# Patient Record
Sex: Male | Born: 1990 | Race: Black or African American | Hispanic: No | Marital: Single | Smoking: Never smoker
Health system: Southern US, Community
[De-identification: ages and names within clinical notes are randomized; demographics above are authoritative.]

---

## 2012-06-12 ENCOUNTER — Encounter (HOSPITAL_COMMUNITY): Payer: Self-pay | Admitting: Nurse Practitioner

## 2012-06-12 ENCOUNTER — Emergency Department (HOSPITAL_COMMUNITY)
Admission: EM | Admit: 2012-06-12 | Discharge: 2012-06-12 | Discharge status: Against Medical Advice | Payer: Self-pay | Attending: Emergency Medicine | Admitting: Emergency Medicine

## 2012-06-12 DIAGNOSIS — R112 Nausea with vomiting, unspecified: Secondary | ICD-10-CM | POA: Insufficient documentation

## 2012-06-12 DIAGNOSIS — R10819 Abdominal tenderness, unspecified site: Secondary | ICD-10-CM | POA: Insufficient documentation

## 2012-06-12 NOTE — ED Notes (Signed)
pt

## 2012-06-12 NOTE — ED Notes (Signed)
Pt presented to nurses station stating he felt "much better after vomiting" and wanting to leave. Encouraged pt to stay for md eval, pt states he will continue to wait. Shortly after pt told registration staff he was going to go home

## 2012-06-12 NOTE — ED Notes (Signed)
Per ems: pt reports a cold since yesterday, friend gave him "what he thought was 10 tylenol" but pt reports immediately after taking the pills he was concerned they may not be tylenol so called ems. Pt reports n/v since last night that is worse now, as well as onset abd tenderness after taking the pills. Pt is A&Ox4, breathing easily, VSS.

## 2016-10-22 ENCOUNTER — Encounter (HOSPITAL_COMMUNITY): Payer: Self-pay | Admitting: Emergency Medicine

## 2016-10-22 DIAGNOSIS — Y92410 Unspecified street and highway as the place of occurrence of the external cause: Secondary | ICD-10-CM | POA: Diagnosis not present

## 2016-10-22 DIAGNOSIS — Y939 Activity, unspecified: Secondary | ICD-10-CM | POA: Diagnosis not present

## 2016-10-22 DIAGNOSIS — Y998 Other external cause status: Secondary | ICD-10-CM | POA: Insufficient documentation

## 2016-10-22 DIAGNOSIS — S01511A Laceration without foreign body of lip, initial encounter: Secondary | ICD-10-CM | POA: Insufficient documentation

## 2016-10-22 DIAGNOSIS — S0990XA Unspecified injury of head, initial encounter: Secondary | ICD-10-CM | POA: Diagnosis present
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
## 2016-10-23 ENCOUNTER — Emergency Department (HOSPITAL_COMMUNITY): Payer: No Typology Code available for payment source

## 2016-10-23 ENCOUNTER — Emergency Department (HOSPITAL_COMMUNITY)
Admission: EM | Admit: 2016-10-23 | Discharge: 2016-10-23 | Disposition: A | Discharge status: Home / Self Care | Payer: No Typology Code available for payment source | Attending: Emergency Medicine | Admitting: Emergency Medicine

## 2016-10-23 DIAGNOSIS — S0003XA Contusion of scalp, initial encounter: Secondary | ICD-10-CM

## 2016-10-23 DIAGNOSIS — S01511A Laceration without foreign body of lip, initial encounter: Secondary | ICD-10-CM

## 2016-10-23 DIAGNOSIS — S0083XA Contusion of other part of head, initial encounter: Secondary | ICD-10-CM

## 2016-10-23 MED ORDER — LIDOCAINE-EPINEPHRINE (PF) 2 %-1:200000 IJ SOLN
20.0000 mL | Freq: Once | INTRAMUSCULAR | Status: AC
  Administered 2016-10-23: 20 mL
  Filled 2016-10-23: qty 20

## 2016-10-23 MED ORDER — HYDROCODONE-ACETAMINOPHEN 5-325 MG PO TABS
1.0000 | ORAL_TABLET | Freq: Once | ORAL | Status: AC
  Administered 2016-10-23: 1 via ORAL
  Filled 2016-10-23: qty 1

## 2016-10-23 MED ORDER — NAPROXEN 500 MG PO TABS
500.0000 mg | ORAL_TABLET | Freq: Two times a day (BID) | ORAL | 0 refills | Status: DC
Start: 2016-10-23 — End: 2020-07-13

## 2016-10-23 MED ORDER — HYDROCODONE-ACETAMINOPHEN 5-325 MG PO TABS: 1.0000 | ORAL_TABLET | ORAL | 0 refills | Status: DC | PRN
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
## 2017-07-04 DIAGNOSIS — Y939 Activity, unspecified: Secondary | ICD-10-CM | POA: Insufficient documentation

## 2017-07-04 DIAGNOSIS — S82114A Nondisplaced fracture of right tibial spine, initial encounter for closed fracture: Secondary | ICD-10-CM | POA: Insufficient documentation

## 2017-07-04 DIAGNOSIS — S82141A Displaced bicondylar fracture of right tibia, initial encounter for closed fracture: Secondary | ICD-10-CM | POA: Insufficient documentation

## 2017-07-04 DIAGNOSIS — S83511A Sprain of anterior cruciate ligament of right knee, initial encounter: Secondary | ICD-10-CM | POA: Insufficient documentation

## 2017-07-04 DIAGNOSIS — Y929 Unspecified place or not applicable: Secondary | ICD-10-CM | POA: Insufficient documentation

## 2017-07-04 DIAGNOSIS — Y999 Unspecified external cause status: Secondary | ICD-10-CM | POA: Insufficient documentation

## 2017-07-04 DIAGNOSIS — W1830XA Fall on same level, unspecified, initial encounter: Secondary | ICD-10-CM | POA: Insufficient documentation

## 2017-07-05 ENCOUNTER — Emergency Department (HOSPITAL_COMMUNITY)
Admission: EM | Admit: 2017-07-05 | Discharge: 2017-07-05 | Disposition: A | Discharge status: Home / Self Care | Payer: Self-pay | Attending: Emergency Medicine | Admitting: Emergency Medicine

## 2017-07-05 ENCOUNTER — Other Ambulatory Visit: Payer: Self-pay

## 2017-07-05 ENCOUNTER — Emergency Department (HOSPITAL_COMMUNITY): Payer: Self-pay

## 2017-07-05 DIAGNOSIS — S83511A Sprain of anterior cruciate ligament of right knee, initial encounter: Secondary | ICD-10-CM

## 2017-07-05 DIAGNOSIS — S82141A Displaced bicondylar fracture of right tibia, initial encounter for closed fracture: Secondary | ICD-10-CM

## 2017-07-05 DIAGNOSIS — S82116A Nondisplaced fracture of unspecified tibial spine, initial encounter for closed fracture: Secondary | ICD-10-CM

## 2017-07-05 MED ORDER — IBUPROFEN 200 MG PO TABS
400.0000 mg | ORAL_TABLET | Freq: Once | ORAL | Status: AC | PRN
  Administered 2017-07-05: 400 mg via ORAL
  Filled 2017-07-05: qty 2

## 2017-07-05 MED ORDER — IBUPROFEN 600 MG PO TABS: 600.0000 mg | ORAL_TABLET | Freq: Four times a day (QID) | ORAL | 0 refills | Status: DC | PRN

## 2017-07-05 MED ORDER — HYDROCODONE-ACETAMINOPHEN 5-325 MG PO TABS: 1.0000 | ORAL_TABLET | Freq: Four times a day (QID) | ORAL | 0 refills | Status: DC | PRN

## 2017-07-05 MED ORDER — ACETAMINOPHEN 500 MG PO TABS
1000.0000 mg | ORAL_TABLET | Freq: Once | ORAL | Status: AC
  Administered 2017-07-05: 1000 mg via ORAL
  Filled 2017-07-05: qty 2

## 2017-07-05 MED ORDER — OXYCODONE-ACETAMINOPHEN 5-325 MG PO TABS
2.0000 | ORAL_TABLET | Freq: Once | ORAL | Status: DC
  Filled 2017-07-05: qty 2

## 2017-07-05 NOTE — ED Notes (Signed)
Bed: WA04 Expected date:  Expected time:  Means of arrival:  Comments: 

## 2017-07-05 NOTE — ED Triage Notes (Signed)
Pt was playing basketball and came down on his right knee wrong and heard a pop. Pt complains of pain 9/10 aching pain. Pt cannot lift leg or stretch it.

## 2017-07-05 NOTE — ED Notes (Signed)
Assumed care of patient.

## 2017-07-05 NOTE — Discharge Instructions (Signed)
Wear a knee immobilizer at all times.  You may remove this when showering or bathing.  Continue to ice your knee 3-4 times per day for 15-20 minutes each time.  Refrain from putting any weight on your right leg.  You have been prescribed ibuprofen to take every 6 hours for pain management and inflammation.  You may supplement this with Norco as prescribed.  Do not drive or drink alcohol after taking Norco as it may make you drowsy and impair your judgment.  Do not take Tylenol/acetaminophen when taking Norco as there is already Tylenol in this medication.    Call the office of Dr. Linna CapriceSwinteck in the morning to schedule close outpatient follow-up to discuss additional treatment and management of your right knee injury.  You may return to the emergency department for new or concerning symptoms.
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
## 2020-01-11 ENCOUNTER — Emergency Department (HOSPITAL_COMMUNITY)
Admission: EM | Admit: 2020-01-11 | Discharge: 2020-01-11 | Disposition: A | Discharge status: Home / Self Care | Payer: Self-pay | Attending: Emergency Medicine | Admitting: Emergency Medicine

## 2020-01-11 DIAGNOSIS — R0789 Other chest pain: Secondary | ICD-10-CM | POA: Insufficient documentation

## 2020-01-11 DIAGNOSIS — Z5321 Procedure and treatment not carried out due to patient leaving prior to being seen by health care provider: Secondary | ICD-10-CM | POA: Insufficient documentation
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
## 2020-01-11 NOTE — ED Notes (Signed)
Pt given discharge paperwork and left in police custody.
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
## 2020-07-13 ENCOUNTER — Emergency Department (HOSPITAL_COMMUNITY)
Admission: EM | Admit: 2020-07-13 | Discharge: 2020-07-13 | Disposition: A | Discharge status: Home / Self Care | Attending: Emergency Medicine | Admitting: Emergency Medicine

## 2020-07-13 ENCOUNTER — Emergency Department (HOSPITAL_COMMUNITY)

## 2020-07-13 ENCOUNTER — Encounter (HOSPITAL_COMMUNITY): Payer: Self-pay

## 2020-07-13 DIAGNOSIS — R112 Nausea with vomiting, unspecified: Secondary | ICD-10-CM | POA: Insufficient documentation

## 2020-07-13 DIAGNOSIS — R1013 Epigastric pain: Secondary | ICD-10-CM | POA: Diagnosis present

## 2020-07-13 DIAGNOSIS — R197 Diarrhea, unspecified: Secondary | ICD-10-CM | POA: Insufficient documentation

## 2020-07-13 LAB — CBC WITH DIFFERENTIAL/PLATELET
Abs Immature Granulocytes: 0.01 10*3/uL (ref 0.00–0.07)
Basophils Absolute: 0.1 10*3/uL (ref 0.0–0.1)
Basophils Relative: 1 %
Eosinophils Absolute: 0.1 10*3/uL (ref 0.0–0.5)
Eosinophils Relative: 1 %
HCT: 48.2 % (ref 39.0–52.0)
Hemoglobin: 16.2 g/dL (ref 13.0–17.0)
Immature Granulocytes: 0 %
Lymphocytes Relative: 31 %
Lymphs Abs: 1.7 10*3/uL (ref 0.7–4.0)
MCH: 30.3 pg (ref 26.0–34.0)
MCHC: 33.6 g/dL (ref 30.0–36.0)
MCV: 90.3 fL (ref 80.0–100.0)
Monocytes Absolute: 0.5 10*3/uL (ref 0.1–1.0)
Monocytes Relative: 9 %
Neutro Abs: 3.2 10*3/uL (ref 1.7–7.7)
Neutrophils Relative %: 58 %
Platelets: 189 10*3/uL (ref 150–400)
RBC: 5.34 MIL/uL (ref 4.22–5.81)
RDW: 12.3 % (ref 11.5–15.5)
WBC: 5.5 10*3/uL (ref 4.0–10.5)
nRBC: 0 % (ref 0.0–0.2)

## 2020-07-13 LAB — COMPREHENSIVE METABOLIC PANEL
ALT: 26 U/L (ref 0–44)
AST: 26 U/L (ref 15–41)
Albumin: 4.8 g/dL (ref 3.5–5.0)
Alkaline Phosphatase: 61 U/L (ref 38–126)
Anion gap: 10 (ref 5–15)
BUN: 12 mg/dL (ref 6–20)
CO2: 26 mmol/L (ref 22–32)
Calcium: 9.9 mg/dL (ref 8.9–10.3)
Chloride: 106 mmol/L (ref 98–111)
Creatinine, Ser: 1.18 mg/dL (ref 0.61–1.24)
GFR, Estimated: >60 mL/min (ref >60)
Glucose, Bld: 98 mg/dL (ref 70–99)
Potassium: 4 mmol/L (ref 3.5–5.1)
Sodium: 142 mmol/L (ref 135–145)
Total Bilirubin: 1 mg/dL (ref 0.3–1.2)
Total Protein: 8.1 g/dL (ref 6.5–8.1)

## 2020-07-13 LAB — URINALYSIS, ROUTINE W REFLEX MICROSCOPIC
Bilirubin Urine: NEGATIVE
Glucose, UA: NEGATIVE mg/dL
Hgb urine dipstick: NEGATIVE
Ketones, ur: 20 mg/dL — AB
Leukocytes,Ua: NEGATIVE
Nitrite: NEGATIVE
Protein, ur: NEGATIVE mg/dL
Specific Gravity, Urine: 1.014 (ref 1.005–1.030)
pH: 7 (ref 5.0–8.0)

## 2020-07-13 LAB — LIPASE, BLOOD: Lipase: 24 U/L (ref 11–51)

## 2020-07-13 MED ORDER — IOHEXOL 300 MG/ML  SOLN
100.0000 mL | Freq: Once | INTRAMUSCULAR | Status: AC | PRN
  Administered 2020-07-13: 100 mL via INTRAVENOUS

## 2020-07-13 MED ORDER — SODIUM CHLORIDE 0.9 % IV BOLUS
1000.0000 mL | Freq: Once | INTRAVENOUS | Status: AC
  Administered 2020-07-13: 1000 mL via INTRAVENOUS

## 2020-07-13 MED ORDER — ALUM & MAG HYDROXIDE-SIMETH 200-200-20 MG/5ML PO SUSP
30.0000 mL | Freq: Once | ORAL | Status: AC
  Administered 2020-07-13: 30 mL via ORAL
  Filled 2020-07-13: qty 30

## 2020-07-13 MED ORDER — LIDOCAINE VISCOUS HCL 2 % MT SOLN
15.0000 mL | Freq: Once | OROMUCOSAL | Status: AC
  Administered 2020-07-13: 15 mL via ORAL
  Filled 2020-07-13: qty 15

## 2020-07-13 MED ORDER — PANTOPRAZOLE SODIUM 20 MG PO TBEC: 20.0000 mg | DELAYED_RELEASE_TABLET | Freq: Every day | ORAL | 0 refills | Status: AC

## 2020-07-13 NOTE — ED Provider Notes (Signed)
[facility]-EMERGENCY DEPT Provider Note   CSN: [id number] Arrival date & time: 07/13/20  1240     History Chief Complaint  Patient presents with  . Abdominal Pain    Antonio Murray is a 30 y.o. male with no pertinent past medical history.  Patient presents with chief complaint of epigastric pain.  Patient reports pain began suddenly approximately 1100 while at rest.  Patient describes pain as "stabbing and pounding."  Patient rates pain 7/10 on the pain scale.  Patient reports pain is constant however waxes and wanes in intensity.  Patient denies any aggravating or alleviating factors.  Patient reports he has had no change in pain with eating.  Patient reports that he felt his pain radiates to his neck once into his right thigh once.  Patient endorses nausea and vomiting.  Reports he had 1 episode of vomiting earlier this morning.  Patient describes his emesis as clear.  He denies any bloody emesis or coffee-ground emesis.  Patient denies any history of abdominal surgeries.  Patient denies any fevers, chills, abdominal distention, rectal bleeding, constipation, urinary symptoms, swelling to genitals, tenderness to genitals, penile discharge, dizziness, lightheadedness, syncope, numbness, weakness, chest pain, shortness of breath.  HPI     History reviewed. No pertinent past medical history.  There are no problems to display for this patient.   History reviewed. No pertinent surgical history.     History reviewed. No pertinent family history.  Social History   Tobacco Use  . Smoking status: Never Smoker  . Smokeless tobacco: Never Used    Home Medications Prior to Admission medications   Medication Sig Start Date End Date Taking? Authorizing Provider  diphenhydrAMINE (BENADRYL) 25 mg capsule Take 25 mg by mouth every 6 (six) hours as needed for allergies.   Yes [provider]  loratadine (CLARITIN) 10 MG tablet Take 10 mg by mouth daily as  needed for allergies.   Yes [provider]    Allergies    Patient has no known allergies.  Review of Systems   Review of Systems  Constitutional: Negative for chills and fever.  Eyes: Negative for visual disturbance.  Respiratory: Negative for shortness of breath.   Cardiovascular: Negative for chest pain.  Gastrointestinal: Positive for abdominal pain, nausea and vomiting. Negative for abdominal distention, anal bleeding, blood in stool, constipation, diarrhea and rectal pain.  Genitourinary: Negative for decreased urine volume, difficulty urinating, dysuria, flank pain, frequency, genital sores, hematuria, penile discharge, penile pain, penile swelling, scrotal swelling and testicular pain.  Musculoskeletal: Negative for back pain and neck pain.  Skin: Negative for color change and rash.  Neurological: Negative for dizziness, syncope, weakness, light-headedness, numbness and headaches.  Psychiatric/Behavioral: Negative for confusion.    Physical Exam Updated Vital Signs BP 132/77   Pulse 61   Temp 98.3 F (36.8 C) (Oral)   Resp 17   SpO2 100%   Physical Exam Vitals and nursing note reviewed.  Constitutional:      General: He is not in acute distress.    Appearance: He is not ill-appearing, toxic-appearing or diaphoretic.     Interventions: He is restrained.     Comments: Patient is in police custody and restrained via handcuffs  HENT:     Head: Normocephalic.  Eyes:     General: No scleral icterus.       Right eye: No discharge.        Left eye: No discharge.  Cardiovascular:     Rate and  Rhythm: Normal rate.     Pulses:          Carotid pulses are 3+ on the right side and 3+ on the left side.      Radial pulses are 3+ on the right side and 3+ on the left side.       Femoral pulses are 3+ on the right side and 3+ on the left side.    Heart sounds: Normal heart sounds.  Pulmonary:     Effort: Pulmonary effort is normal. No respiratory distress.      Breath sounds: Normal breath sounds.  Abdominal:     General: Abdomen is flat. Bowel sounds are normal. There is no distension. There are no signs of injury.     Palpations: Abdomen is soft. There is no mass or pulsatile mass.     Tenderness: There is abdominal tenderness in the epigastric area. There is no right CVA tenderness, left CVA tenderness, guarding or rebound. Negative signs include psoas sign.     Hernia: There is no hernia in the umbilical area or ventral area.  Musculoskeletal:     Cervical back: Neck supple.  Skin:    General: Skin is warm and dry.     Coloration: Skin is not jaundiced or pale.  Neurological:     General: No focal deficit present.     Mental Status: He is alert.  Psychiatric:        Behavior: Behavior is cooperative.     ED Results / Procedures / Treatments   Labs (all labs ordered are listed, but only abnormal results are displayed) Labs Reviewed  URINALYSIS, ROUTINE W REFLEX MICROSCOPIC - Abnormal; Notable for the following components:      Result Value   Ketones, ur 20 (*)    All other components within normal limits  CBC WITH DIFFERENTIAL/PLATELET  COMPREHENSIVE METABOLIC PANEL  LIPASE, BLOOD    EKG None  Radiology CT Abdomen Pelvis W Contrast  Result Date: 07/13/2020 CLINICAL DATA:  30 year old male with epigastric pain. EXAM: CT ABDOMEN AND PELVIS WITH CONTRAST TECHNIQUE: Multidetector CT imaging of the abdomen and pelvis was performed using the standard protocol following bolus administration of intravenous contrast. CONTRAST:  OMNIPAQUE IOHEXOL 300 MG/ML  SOLN COMPARISON:  None. FINDINGS: Lower chest: The visualized lung bases are clear. No intra-abdominal free air or free fluid. Hepatobiliary: No focal liver abnormality is seen. No gallstones, gallbladder wall thickening, or biliary dilatation. Pancreas: Unremarkable. No pancreatic ductal dilatation or surrounding inflammatory changes. Spleen: Normal in size without focal  abnormality. Adrenals/Urinary Tract: The adrenal glands are unremarkable. The kidneys, visualized ureters, and urinary bladder appear unremarkable. Stomach/Bowel: There is no bowel obstruction or active inflammation. The appendix is normal. Vascular/Lymphatic: The abdominal aorta and IVC are unremarkable. No portal venous gas. There is no adenopathy. Reproductive: The prostate and seminal vesicles are grossly unremarkable. No pelvic mass. Other: None Musculoskeletal: No acute or significant osseous findings. IMPRESSION: No acute intra-abdominal or pelvic pathology. Electronically Signed   By: Elgie Collard M.D.   On: 07/13/2020 17:12    Procedures Procedures   Medications Ordered in ED Medications  alum & mag hydroxide-simeth (MAALOX/MYLANTA) 200-200-20 MG/5ML suspension 30 mL (30 mLs Oral Given 07/13/20 1542)    And  lidocaine (XYLOCAINE) 2 % viscous mouth solution 15 mL (15 mLs Oral Given 07/13/20 1542)  sodium chloride 0.9 % bolus 1,000 mL (0 mLs Intravenous Stopped 07/13/20 1755)  iohexol (OMNIPAQUE) 300 MG/ML solution 100 mL (100 mLs Intravenous Contrast Given  07/13/20 1708)    ED Course  I have reviewed the triage vital signs and the nursing notes.  Pertinent labs & imaging results that were available during my care of the patient were reviewed by me and considered in my medical decision making (see chart for details).    MDM Rules/Calculators/A&P                          Alert 30 year old male no acute distress, nontoxic-appearing.  Patient presents with chief complaint of epigastric pain.  Patient reports sudden onset of "stabbing/pounding" pain.  Patient reports that pain started at 1100, while patient was at rest.  Pain has been intermittent since then.  Patient denies any change in symptoms with p.o. intake or exertion.  Patient denies any alleviating factors.  Patient reports that he had 1 instance of radiation up to the right side of his neck and another incidence of radiation  down to his right thigh.  On physical exam patient abdomen has normoactive bowel sounds.  Abdomen is soft, nondistended, tenderness to epigastric area.  No rebound tenderness, guarding, mass, pulsatile mass, umbilical hernia, ventral hernia, CVA tenderness observed.  Patient has +3 carotid, radial, and femoral pulses bilaterally.  Patient is hemodynamically stable.  Low suspicion for aortic dissection at this time. Will obtain urinalysis, CMP, CBC, lipase.  Patient given GI cocktail.  Lipase within normal limits. CMP within normal limits. CBC within normal limits. Urinalysis shows no signs of infection.  Ketones were noted this is likely due to decreased p.o. intake today due to his pain and leaving jail to come to the emergency department.  Patient reports improvement in his symptoms after receiving GI cocktail.  Will obtain CT abdomen pelvis to evaluate for possible intra-abdominal cause of his epigastric pain. CT abdomen pelvis showed no acute intra-abdominal or pelvic pathology.  On repeat examination patient's abdomen remains soft, nondistended.  Patient hemodynamically stable.  Patient able to tolerate p.o. intake without difficulty.  We will start patient on Protonix for possible GERD and/or peptic ulcer disease.  Patient advised to follow-up with primary care provider.  Discussed results, findings, treatment and follow up. Patient advised of return precautions. Patient verbalized understanding and agreed with plan.   Final Clinical Impression(s) / ED Diagnoses Final diagnoses:  Epigastric pain  Nausea vomiting and diarrhea    Rx / DC Orders ED Discharge Orders         Ordered    pantoprazole (PROTONIX) 20 MG tablet  Daily        07/13/20 1749           Haskel Schroeder, PA-C 07/14/20 0000    Charlynne Pander, MD 07/15/20 1500

## 2020-07-13 NOTE — ED Triage Notes (Signed)
Pt arrived via police, from jail, states he started vomiting, was feeling better and then once back in cell pain and vomiting worsening.

## 2020-07-13 NOTE — ED Triage Notes (Signed)
Emergency Medicine Provider Triage Evaluation Note  Antonio Murray , a 30 y.o. male  was evaluated in triage.  Pt complains of sharp stomach pain, umbilical pain. Vomiting. Diarrhea. Began this AM.   Review of Systems  Positive: Abdominal pain, diarrhea, vomiting  Negative: fever  Physical Exam  BP (!) 145/80 (BP Location: Left Arm)   Pulse 94   Temp 98.3 F (36.8 C) (Oral)   Resp 16   SpO2 95%  Gen:   Awake, no distress   HEENT:  Atraumatic  Resp:  Normal effort  Cardiac:  Normal rate  Abd:   Nondistended,tender to umbilical area, no guarding  MSK:   Moves extremities without difficulty  Neuro:  Speech clear   Medical Decision Making  Medically screening exam initiated at 12:50 PM.  Appropriate orders placed.  Aldo Sondgeroth was informed that the remainder of the evaluation will be completed by another provider, this initial triage assessment does not replace that evaluation, and the importance of remaining in the ED until their evaluation is complete.  Clinical Impression  Stable  MSE was initiated and I personally evaluated the patient and placed orders (if any) at  12:51 PM on July 13, 2020.  The patient appears stable so that the remainder of the MSE may be completed by another provider.    Farrel Gordon, PA-C 07/13/20 1252

## 2020-07-13 NOTE — Discharge Instructions (Addendum)
You came to the emerge department today to be evaluated for your epigastric abdominal pain.  Your physical exam, lab work, and CT scan were reassuring.  We can find no acute cause of your symptoms today.  I have started you on a medication Protonix.  Please take 1 pill once daily to help with any acid reflux you may be experiencing.  Please follow-up with your primary care provider.  Get help right away if: Your pain does not go away as soon as your health care provider told you to expect. You cannot stop vomiting. Your pain is only in areas of the abdomen, such as the right side or the left lower portion of the abdomen. Pain on the right side could be caused by appendicitis. You have bloody or black stools, or stools that look like tar. You have severe pain, cramping, or bloating in your abdomen. You have signs of dehydration, such as: Dark urine, very little urine, or no urine. Cracked lips. Dry mouth. Sunken eyes. Sleepiness. Weakness. You have trouble breathing or chest pain.

## 2021-11-08 IMAGING — CT CT ABD-PELV W/ CM
2 of 4 series · 16 of 46 positions shown, 18 images · IV contrast (OMNIPAQUE 300)
Comparison: None.

CLINICAL DATA: 29-year-old male with epigastric pain.

EXAM:
CT ABDOMEN AND PELVIS WITH CONTRAST
TECHNIQUE: Multidetector CT imaging of the abdomen and pelvis was performed
using the standard protocol following bolus administration of
intravenous contrast.
CONTRAST:  100mL OMNIPAQUE IOHEXOL 300 MG/ML  SOLN

[Series 2: axial st · axial · 0.81mm/px · z∈[-519,-144]mm · 13 of 85 slices shown, 15 images]
[im 5/85  soft-tissue]
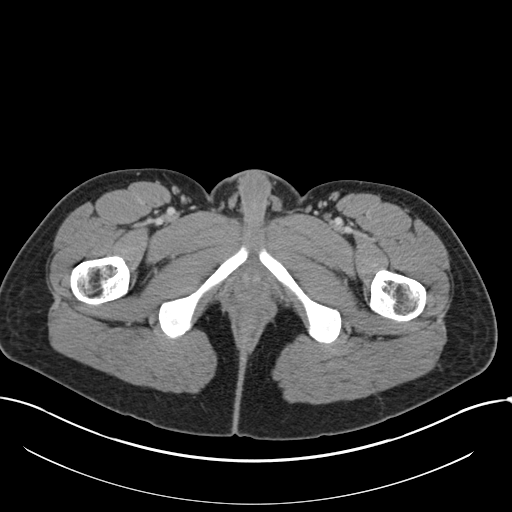
[im 5/85  bone]
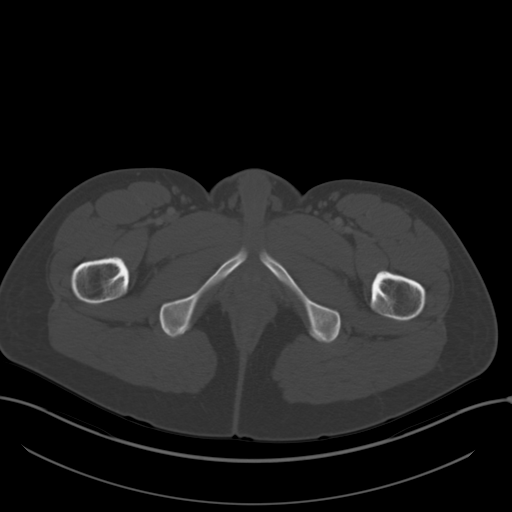
[im 10/85  soft-tissue]
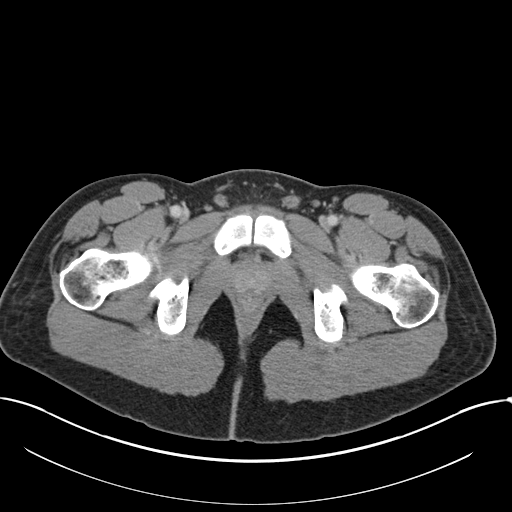
[im 19/85  soft-tissue]
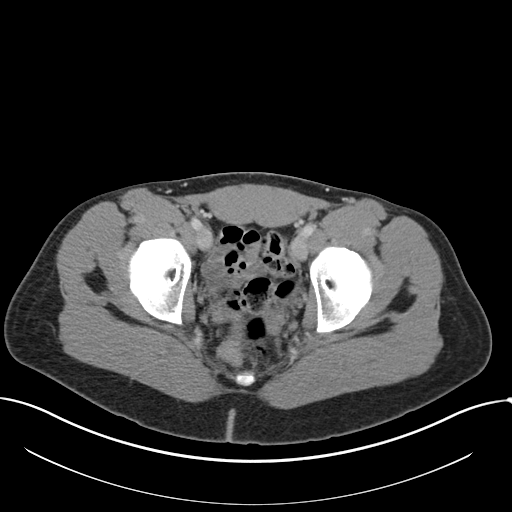
[im 24/85  soft-tissue]
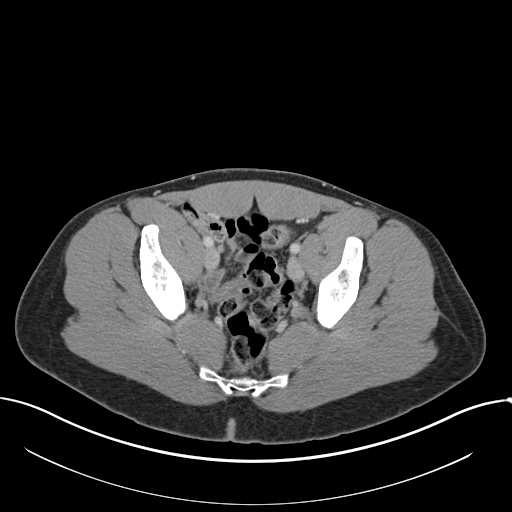
[im 29/85  soft-tissue]
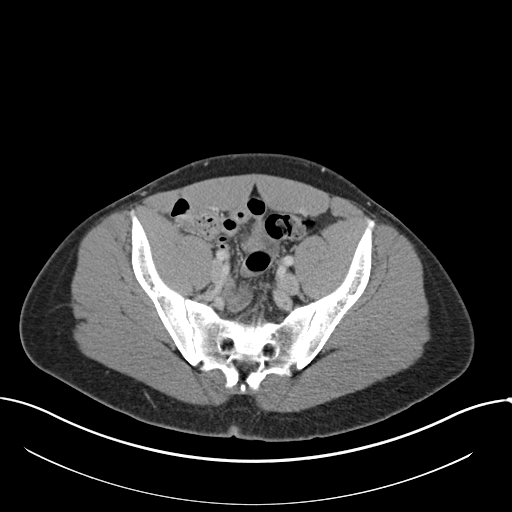
[im 38/85  soft-tissue]
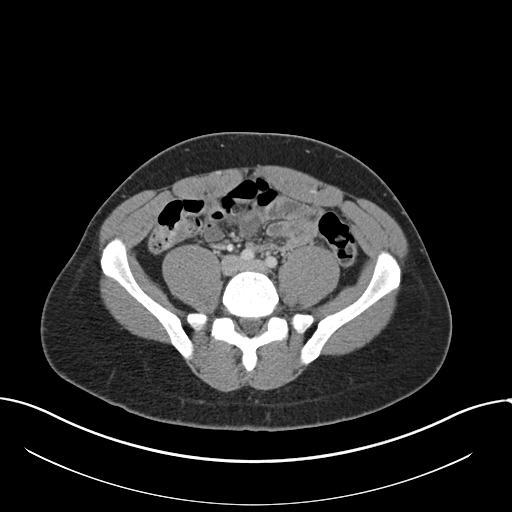
[im 43/85  soft-tissue]
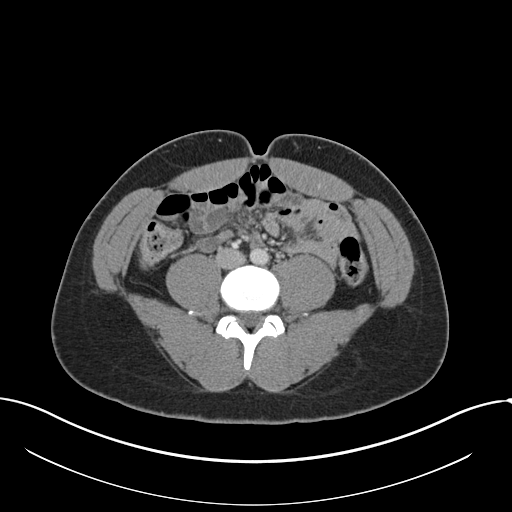
[im 47/85  soft-tissue]
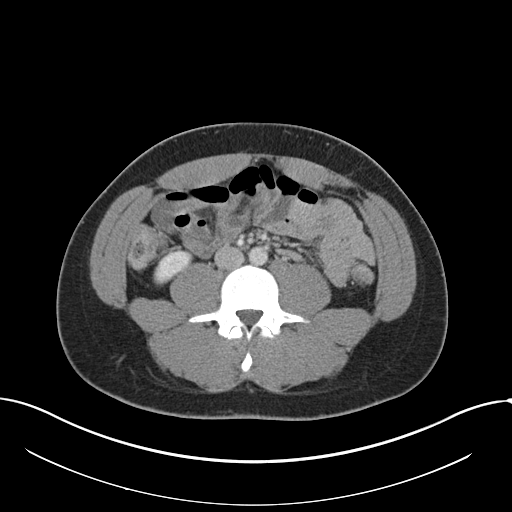
[im 57/85  soft-tissue]
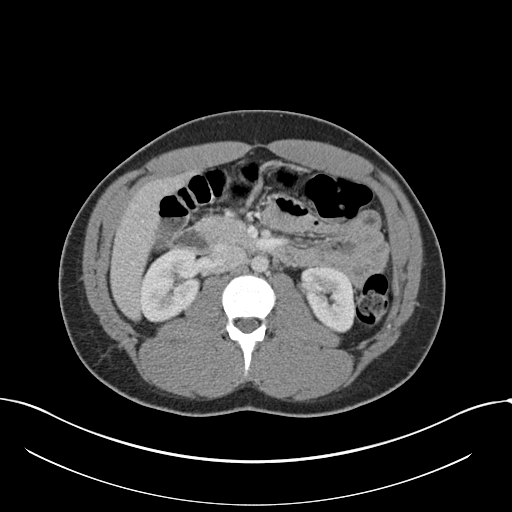
[im 57/85  bone]
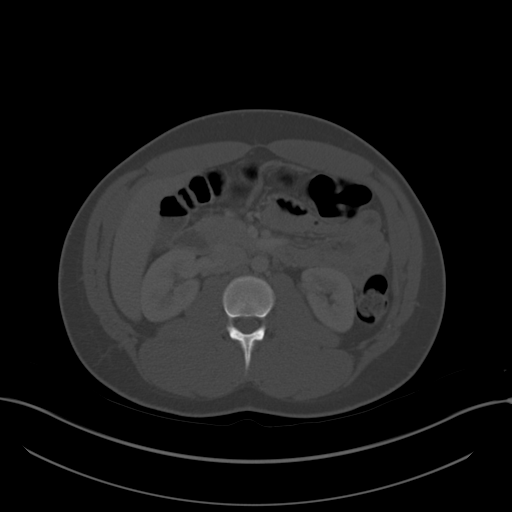
[im 61/85  soft-tissue]
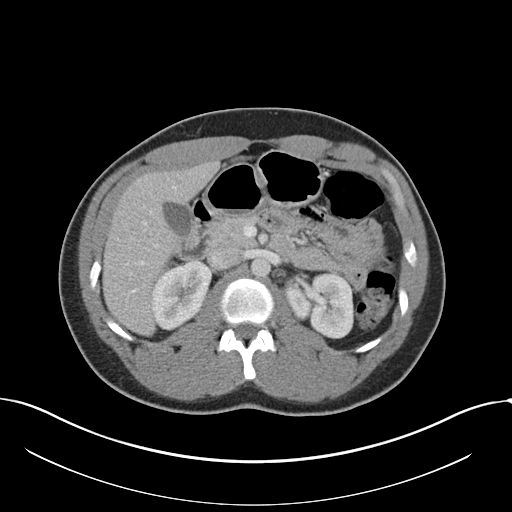
[im 66/85  soft-tissue]
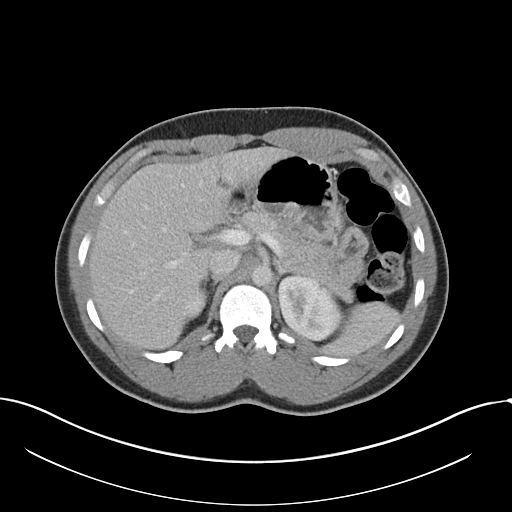
[im 75/85  soft-tissue]
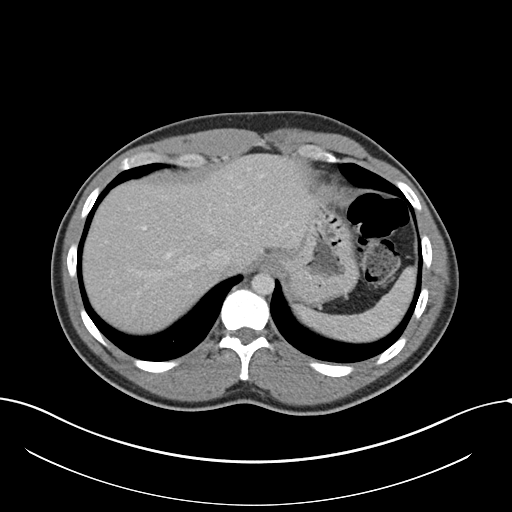
[im 80/85  soft-tissue]
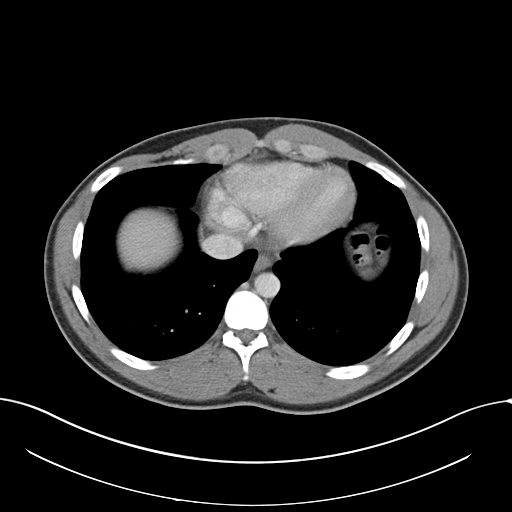

[Series 5: coronal st · coronal · 0.67mm/px · 3 of 151 slices shown]
[im 51/151  soft-tissue]
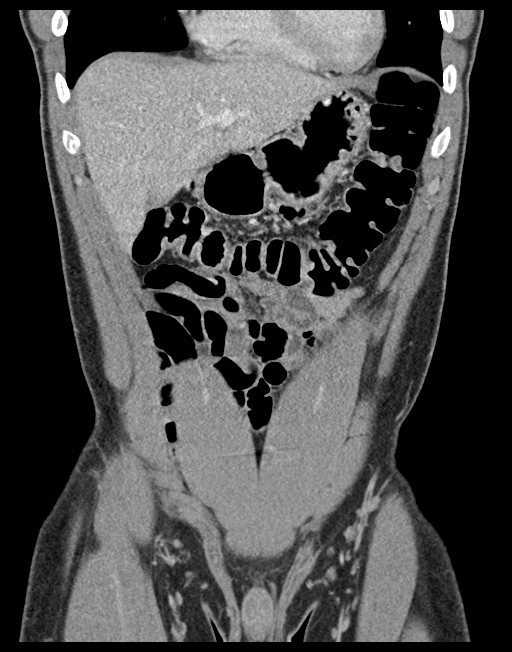
[im 67/151  soft-tissue]
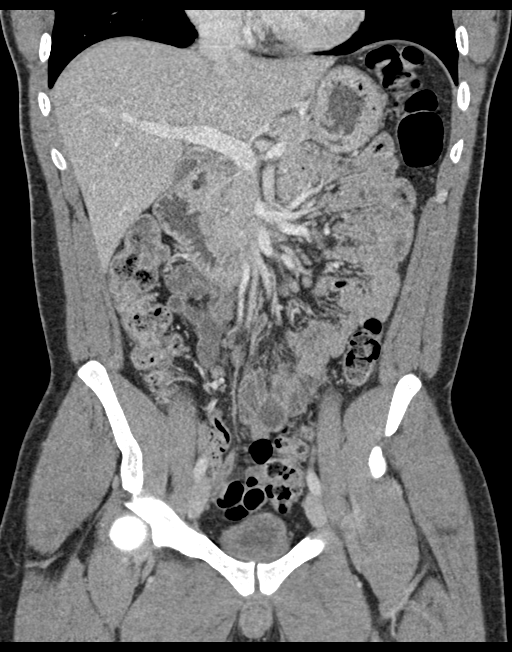
[im 84/151  soft-tissue]
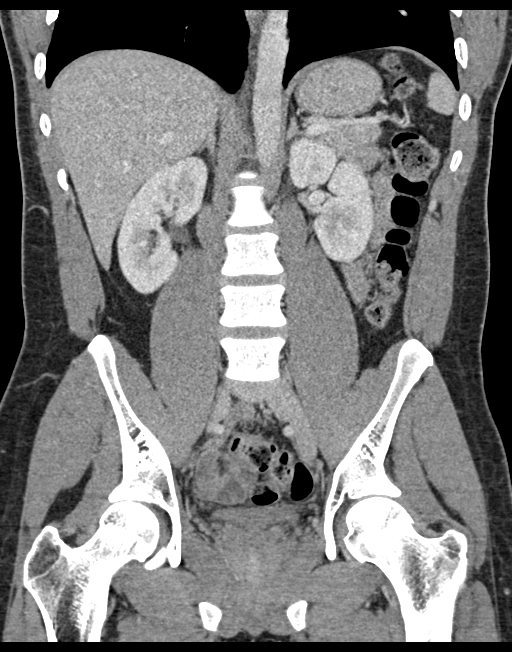

[16 of 46 positions shown; findings below may reference images not displayed]

FINDINGS: Lower chest: The visualized lung bases are clear.

No intra-abdominal free air or free fluid.

Hepatobiliary: No focal liver abnormality is seen. No gallstones,
gallbladder wall thickening, or biliary dilatation.

Pancreas: Unremarkable. No pancreatic ductal dilatation or
surrounding inflammatory changes.

Spleen: Normal in size without focal abnormality.

Adrenals/Urinary Tract: The adrenal glands are unremarkable. The
kidneys, visualized ureters, and urinary bladder appear
unremarkable.

Stomach/Bowel: There is no bowel obstruction or active inflammation.
The appendix is normal.

Vascular/Lymphatic: The abdominal aorta and IVC are unremarkable. No
portal venous gas. There is no adenopathy.

Reproductive: The prostate and seminal vesicles are grossly
unremarkable. No pelvic mass.

Other: None

Musculoskeletal: No acute or significant osseous findings.
IMPRESSION: No acute intra-abdominal or pelvic pathology.

## 2021-11-13 ENCOUNTER — Emergency Department (HOSPITAL_COMMUNITY): Payer: Self-pay

## 2021-11-13 ENCOUNTER — Other Ambulatory Visit: Payer: Self-pay

## 2021-11-13 ENCOUNTER — Emergency Department (HOSPITAL_COMMUNITY)

## 2021-11-13 ENCOUNTER — Emergency Department (HOSPITAL_COMMUNITY)
Admission: EM | Admit: 2021-11-13 | Discharge: 2021-11-13 | Disposition: A | Discharge status: Home / Self Care | Payer: Self-pay | Attending: Emergency Medicine | Admitting: Emergency Medicine

## 2021-11-13 DIAGNOSIS — Y92410 Unspecified street and highway as the place of occurrence of the external cause: Secondary | ICD-10-CM | POA: Insufficient documentation

## 2021-11-13 DIAGNOSIS — S20212A Contusion of left front wall of thorax, initial encounter: Secondary | ICD-10-CM | POA: Insufficient documentation

## 2021-11-13 DIAGNOSIS — M542 Cervicalgia: Secondary | ICD-10-CM | POA: Diagnosis not present

## 2021-11-13 DIAGNOSIS — D696 Thrombocytopenia, unspecified: Secondary | ICD-10-CM | POA: Diagnosis not present

## 2021-11-13 DIAGNOSIS — S0101XA Laceration without foreign body of scalp, initial encounter: Secondary | ICD-10-CM | POA: Diagnosis present

## 2021-11-13 LAB — I-STAT CHEM 8, ED
BUN: 16 mg/dL (ref 6–20)
Calcium, Ion: 1.22 mmol/L (ref 1.15–1.40)
Chloride: 102 mmol/L (ref 98–111)
Creatinine, Ser: 1.1 mg/dL (ref 0.61–1.24)
Glucose, Bld: 106 mg/dL — ABNORMAL HIGH (ref 70–99)
HCT: 42 % (ref 39.0–52.0)
Hemoglobin: 14.3 g/dL (ref 13.0–17.0)
Potassium: 3.7 mmol/L (ref 3.5–5.1)
Sodium: 142 mmol/L (ref 135–145)
TCO2: 28 mmol/L (ref 22–32)

## 2021-11-13 LAB — CBC WITH DIFFERENTIAL/PLATELET
Abs Immature Granulocytes: 0.01 10*3/uL (ref 0.00–0.07)
Basophils Absolute: 0 10*3/uL (ref 0.0–0.1)
Basophils Relative: 1 %
Eosinophils Absolute: 0.2 10*3/uL (ref 0.0–0.5)
Eosinophils Relative: 4 %
HCT: 42.2 % (ref 39.0–52.0)
Hemoglobin: 14.1 g/dL (ref 13.0–17.0)
Immature Granulocytes: 0 %
Lymphocytes Relative: 25 %
Lymphs Abs: 1.3 10*3/uL (ref 0.7–4.0)
MCH: 30.7 pg (ref 26.0–34.0)
MCHC: 33.4 g/dL (ref 30.0–36.0)
MCV: 91.7 fL (ref 80.0–100.0)
Monocytes Absolute: 0.4 10*3/uL (ref 0.1–1.0)
Monocytes Relative: 7 %
Neutro Abs: 3.3 10*3/uL (ref 1.7–7.7)
Neutrophils Relative %: 63 %
Platelets: 149 10*3/uL — ABNORMAL LOW (ref 150–400)
RBC: 4.6 MIL/uL (ref 4.22–5.81)
RDW: 11.8 % (ref 11.5–15.5)
WBC: 5.2 10*3/uL (ref 4.0–10.5)
nRBC: 0 % (ref 0.0–0.2)

## 2021-11-13 MED ORDER — MORPHINE SULFATE (PF) 4 MG/ML IV SOLN
4.0000 mg | Freq: Once | INTRAVENOUS | Status: AC
  Administered 2021-11-13: 4 mg via INTRAVENOUS
  Filled 2021-11-13: qty 1

## 2021-11-13 MED ORDER — IOHEXOL 350 MG/ML SOLN
100.0000 mL | Freq: Once | INTRAVENOUS | Status: AC | PRN
  Administered 2021-11-13: 100 mL via INTRAVENOUS

## 2021-11-13 MED ORDER — SODIUM CHLORIDE 0.9 % IV BOLUS
1000.0000 mL | Freq: Once | INTRAVENOUS | Status: DC
Start: 2021-11-13 — End: 2021-11-13

## 2021-11-13 MED ORDER — LIDOCAINE-EPINEPHRINE (PF) 2 %-1:200000 IJ SOLN
20.0000 mL | Freq: Once | INTRAMUSCULAR | Status: AC
  Administered 2021-11-13: 20 mL
  Filled 2021-11-13: qty 20
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
## 2021-11-13 NOTE — Discharge Instructions (Addendum)
At this time there does not appear to be the presence of an emergent medical condition, however there is always the potential for conditions to change. Please read and follow the below instructions.  Please return to the Emergency Department immediately for any new or worsening symptoms. Please be sure to follow up with your Primary Care Provider within one week regarding your visit today; please call their office to schedule an appointment even if you are feeling better for a follow-up visit. Your CT scan of your chest showed some blebs in your right lung.  Please discuss this finding with your primary care provider at your next appointment. Your staples will need to be removed in 7-10 days.  They will need to be removed by medical professional either at an urgent care, your primary care doctor's office or here at the emergency department.   Please read the additional information packets attached to your discharge summary.  Go to the nearest Emergency Department immediately if: You have fever or chills You have very bad swelling around the wound. Your pain suddenly gets worse and is very bad. You have painful lumps near the wound or on skin anywhere on your body. You have a red streak going away from your wound. You have: Loss of feeling (numbness), tingling, or weakness in your arms or legs. Very bad neck pain, especially tenderness in the middle of the back of your neck. A change in your ability to control your pee or poop (stool). More pain in any area of your body. Swelling in any area of your body, especially your legs. Shortness of breath or light-headedness. Chest pain. Blood in your pee, poop, or vomit. Very bad pain in your belly (abdomen) or your back. Very bad headaches or headaches that are getting worse. Sudden vision loss or double vision. Your eye suddenly turns red. The black center of your eye (pupil) is an odd shape or size. You have any new/concerning or worsening of  symptoms  Do not take your medicine if  develop an itchy rash, swelling in your mouth or lips, or difficulty breathing; call 911 and seek immediate emergency medical attention if this occurs.  You may review your lab tests and imaging results in their entirety on your MyChart account.  Please discuss all results of fully with your primary care provider and other specialist at your follow-up visit.  Note: Portions of this text may have been transcribed using voice recognition software. Every effort was made to ensure accuracy; however, inadvertent computerized transcription errors may still be present.

## 2021-12-04 ENCOUNTER — Encounter (HOSPITAL_COMMUNITY): Payer: Self-pay

## 2021-12-04 ENCOUNTER — Emergency Department (HOSPITAL_COMMUNITY)
Admission: EM | Admit: 2021-12-04 | Discharge: 2021-12-04 | Disposition: A | Discharge status: Home / Self Care | Attending: Emergency Medicine | Admitting: Emergency Medicine

## 2021-12-04 ENCOUNTER — Other Ambulatory Visit: Payer: Self-pay

## 2021-12-04 DIAGNOSIS — S0101XD Laceration without foreign body of scalp, subsequent encounter: Secondary | ICD-10-CM | POA: Insufficient documentation

## 2021-12-04 DIAGNOSIS — M545 Low back pain, unspecified: Secondary | ICD-10-CM | POA: Insufficient documentation

## 2021-12-04 DIAGNOSIS — X58XXXD Exposure to other specified factors, subsequent encounter: Secondary | ICD-10-CM | POA: Insufficient documentation

## 2021-12-04 DIAGNOSIS — Z4802 Encounter for removal of sutures: Secondary | ICD-10-CM | POA: Insufficient documentation
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
## 2021-12-04 NOTE — ED Triage Notes (Signed)
Here for staple removal
